# Patient Record
Sex: Male | Born: 1970 | Race: White | Hispanic: No | Marital: Married
Health system: Southern US, Community
[De-identification: ages and names within clinical notes are randomized; demographics above are authoritative.]

---

## 2014-08-18 ENCOUNTER — Encounter (HOSPITAL_COMMUNITY): Payer: Self-pay | Admitting: Emergency Medicine

## 2014-08-18 DIAGNOSIS — R509 Fever, unspecified: Secondary | ICD-10-CM | POA: Diagnosis not present

## 2014-08-18 DIAGNOSIS — H5710 Ocular pain, unspecified eye: Secondary | ICD-10-CM | POA: Diagnosis not present

## 2014-08-18 DIAGNOSIS — H53149 Visual discomfort, unspecified: Secondary | ICD-10-CM | POA: Diagnosis not present

## 2014-08-18 DIAGNOSIS — M436 Torticollis: Secondary | ICD-10-CM | POA: Insufficient documentation

## 2014-08-18 DIAGNOSIS — R5383 Other fatigue: Secondary | ICD-10-CM | POA: Diagnosis not present

## 2014-08-18 DIAGNOSIS — R51 Headache: Secondary | ICD-10-CM | POA: Diagnosis present

## 2014-08-18 LAB — CBC WITH DIFFERENTIAL/PLATELET
BASOS PCT: 0 % (ref 0–1)
Basophils Absolute: 0 10*3/uL (ref 0.0–0.1)
EOS PCT: 0 % (ref 0–5)
Eosinophils Absolute: 0 10*3/uL (ref 0.0–0.7)
HCT: 40.3 % (ref 39.0–52.0)
HEMOGLOBIN: 14.2 g/dL (ref 13.0–17.0)
Lymphocytes Relative: 13 % (ref 12–46)
Lymphs Abs: 1.1 10*3/uL (ref 0.7–4.0)
MCH: 30.9 pg (ref 26.0–34.0)
MCHC: 35.2 g/dL (ref 30.0–36.0)
MCV: 87.8 fL (ref 78.0–100.0)
MONO ABS: 0.6 10*3/uL (ref 0.1–1.0)
Monocytes Relative: 8 % (ref 3–12)
Neutro Abs: 6.1 10*3/uL (ref 1.7–7.7)
Neutrophils Relative %: 79 % — ABNORMAL HIGH (ref 43–77)
Platelets: 172 10*3/uL (ref 150–400)
RBC: 4.59 MIL/uL (ref 4.22–5.81)
RDW: 12.5 % (ref 11.5–15.5)
WBC: 7.8 10*3/uL (ref 4.0–10.5)

## 2014-08-18 LAB — COMPREHENSIVE METABOLIC PANEL
ALBUMIN: 4.5 g/dL (ref 3.5–5.2)
ALT: 27 U/L (ref 0–53)
ANION GAP: 8 (ref 5–15)
AST: 25 U/L (ref 0–37)
Alkaline Phosphatase: 56 U/L (ref 39–117)
BUN: 10 mg/dL (ref 6–23)
CALCIUM: 9.2 mg/dL (ref 8.4–10.5)
CO2: 27 mmol/L (ref 19–32)
CREATININE: 1.02 mg/dL (ref 0.50–1.35)
Chloride: 101 mEq/L (ref 96–112)
GFR calc Af Amer: >90 mL/min (ref >90)
GFR, EST NON AFRICAN AMERICAN: 88 mL/min — AB (ref >90)
Glucose, Bld: 104 mg/dL — ABNORMAL HIGH (ref 70–99)
Potassium: 3.7 mmol/L (ref 3.5–5.1)
Sodium: 136 mmol/L (ref 135–145)
Total Bilirubin: 0.5 mg/dL (ref 0.3–1.2)
Total Protein: 7.3 g/dL (ref 6.0–8.3)

## 2014-08-18 MED ORDER — IBUPROFEN 800 MG PO TABS
800.0000 mg | ORAL_TABLET | Freq: Once | ORAL | Status: AC
  Administered 2014-08-18: 800 mg via ORAL
  Filled 2014-08-18: qty 2

## 2014-08-18 NOTE — ED Notes (Signed)
Per Patient: States today at 0330 he woke up with a headache and stiff neck, reports fever as high as 102.0. Pt does report photophobia, and reports it "hurts to move his eyes." Ax4, NAD.

## 2014-08-18 NOTE — ED Notes (Signed)
Pt reports he has taken OTC Naproxen without pain relief.

## 2014-08-19 ENCOUNTER — Emergency Department (HOSPITAL_COMMUNITY)
Admission: EM | Admit: 2014-08-19 | Discharge: 2014-08-19 | Disposition: A | Discharge status: Home / Self Care | Payer: BC Managed Care – PPO | Attending: Emergency Medicine | Admitting: Emergency Medicine

## 2014-08-19 DIAGNOSIS — R519 Headache, unspecified: Secondary | ICD-10-CM

## 2014-08-19 DIAGNOSIS — R509 Fever, unspecified: Secondary | ICD-10-CM

## 2014-08-19 DIAGNOSIS — R51 Headache: Secondary | ICD-10-CM

## 2014-08-19 NOTE — ED Provider Notes (Signed)
CSN: [id number]     Arrival date & time [id number]/26/15  2143 History  This chart was scribed for Dustin Mercado Acie Custis, MD by Abel PrestoKara Demonbreun, ED Scribe. This patient was seen in room D31C/D31C and the patient's care was started at 2:20 AM.    Chief Complaint  Patient presents with  . Headache  . Torticollis    Patient is a 43 y.o. male presenting with headaches. The history is provided by the patient. No language interpreter was used.  Headache Associated symptoms: eye pain, fatigue, fever, neck stiffness and photophobia   Associated symptoms: no abdominal pain, no back pain, no congestion, no cough, no diarrhea, no sore throat and no vomiting     HPI Comments: Dustin Mercado is a 43 y.o. male who presents to the Emergency Department complaining of waxing and waning headache with onset at 3 am yesterday morning.  Pt notes and associated fever of 102, stiff neck, fatigue, photophobia, and pain with ocular movement.  Pt notes headache feels better when he is not febrile.  Pt has taken Aleve for relief. Pt notes his daughters both have a cough. Pt denies cough, congestion, rhinorrhea, sore throat, diarrhea, vomiting, back pain, abdominal pain, confusion.     History reviewed. No pertinent past medical history. History reviewed. No pertinent past surgical history. No family history on file. History  Substance Use Topics  . Smoking status: Not on file  . Smokeless tobacco: Not on file  . Alcohol Use: Not on file    Review of Systems  Constitutional: Positive for fever and fatigue.  HENT: Negative for congestion, rhinorrhea and sore throat.   Eyes: Positive for photophobia and pain.  Respiratory: Negative for cough.   Gastrointestinal: Negative for vomiting, abdominal pain and diarrhea.  Musculoskeletal: Positive for neck stiffness. Negative for back pain.  Neurological: Positive for headaches.  Psychiatric/Behavioral: Negative for confusion.  All other systems reviewed and are  negative.     Allergies  Review of patient's allergies indicates no known allergies.  Home Medications   Prior to Admission medications   Not on File   BP 106/66 mmHg  Pulse 80  Temp(Src) 98 F (36.7 C) (Oral)  Resp 18  Ht 6' (1.829 m)  Wt 230 lb (104.327 kg)  BMI 31.19 kg/m2  SpO2 97% Physical Exam  Constitutional: He is oriented to person, place, and time. He appears well-developed and well-nourished.  HENT:  Head: Normocephalic.  Right Ear: External ear normal.  Left Ear: External ear normal.  Nose: Nose normal.  Mouth/Throat: Oropharynx is clear and moist. No oropharyngeal exudate.  Mild pain with ROM but no stiffness appreciated   Eyes: EOM are normal. Pupils are equal, round, and reactive to light.  Neck: Normal range of motion. Neck supple.  Cardiovascular: Normal rate, regular rhythm, normal heart sounds and intact distal pulses.  Exam reveals no friction rub.   No murmur heard. Pulmonary/Chest: Effort normal and breath sounds normal. No respiratory distress. He has no wheezes. He has no rales.  Abdominal: Soft. Bowel sounds are normal. He exhibits no distension. There is no tenderness. There is no rebound and no guarding.  Musculoskeletal: Normal range of motion.  Neurological: He is alert and oriented to person, place, and time.  Cranial nerves II-XII grossly intact 5/5 strength in all extremities  Skin: Skin is warm and dry.  Psychiatric: He has a normal mood and affect. His behavior is normal.  Nursing note and vitals reviewed.   ED Course  Procedures (including critical  care time) DIAGNOSTIC STUDIES: Oxygen Saturation is 100% on room air, normal by my interpretation.    COORDINATION OF CARE: 2:24 AM Discussed treatment plan with patient at beside, the patient agrees with the plan and has no further questions at this time.   Labs Review Labs Reviewed  CBC WITH DIFFERENTIAL - Abnormal; Notable for the following:    Neutrophils Relative % 79 (*)     All other components within normal limits  COMPREHENSIVE METABOLIC PANEL - Abnormal; Notable for the following:    Glucose, Bld 104 (*)    GFR calc non Af Amer 88 (*)    All other components within normal limits    Imaging Review No results found.   EKG Interpretation None      MDM   Final diagnoses:  Acute nonintractable headache, unspecified headache type  Fever in adult   Patient with 1 day of headache, fever and neck stiffness. Patient able to fully range neck in ED. No stiffness noted. Normal neuro exam. Given patient's well appearance, normal neck ROM, normal neuro exam, this is likely viral. However, due to his symptoms I had a long discussion with patient and wife about risks/benefits of LP. After hearing this, they have jointly decided to forgo LP at this time and treat with NSAIDs/tylenol at home. I feel this is reasonable give his nontoxic appearance and he clearly has medical decision making capacity. Discussed strict return precautions, which he and wife verbalized understanding of.  I personally performed the services described in this documentation, which was scribed in my presence. The recorded information has been reviewed and is accurate.      Dustin Mercado Dustin Coach, MD 08/19/14 [phone]

## 2014-09-07 ENCOUNTER — Ambulatory Visit
Admission: RE | Admit: 2014-09-07 | Discharge: 2014-09-07 | Disposition: A | Discharge status: Home / Self Care | Payer: BLUE CROSS/BLUE SHIELD | Source: Ambulatory Visit | Attending: Family Medicine | Admitting: Family Medicine

## 2014-09-07 ENCOUNTER — Ambulatory Visit
Admission: RE | Admit: 2014-09-07 | Discharge: 2014-09-07 | Disposition: A | Discharge status: Home / Self Care | Payer: BLUE CROSS/BLUE SHIELD | Source: Ambulatory Visit | Attending: Family | Admitting: Family

## 2014-09-07 ENCOUNTER — Other Ambulatory Visit: Payer: Self-pay | Admitting: Family Medicine

## 2014-09-07 ENCOUNTER — Other Ambulatory Visit: Payer: Self-pay | Admitting: Family

## 2014-09-07 DIAGNOSIS — R911 Solitary pulmonary nodule: Secondary | ICD-10-CM

## 2014-09-07 DIAGNOSIS — R05 Cough: Secondary | ICD-10-CM

## 2014-09-07 DIAGNOSIS — R059 Cough, unspecified: Secondary | ICD-10-CM

## 2015-08-10 IMAGING — CR DG CHEST 2V
2 series · 2 of 2 positions shown · non-contrast
Comparison: None.

CLINICAL DATA: Initial encounter for cough and shortness of breath
for 10 day duration

EXAM:
CHEST  2 VIEW

[w chest pa]
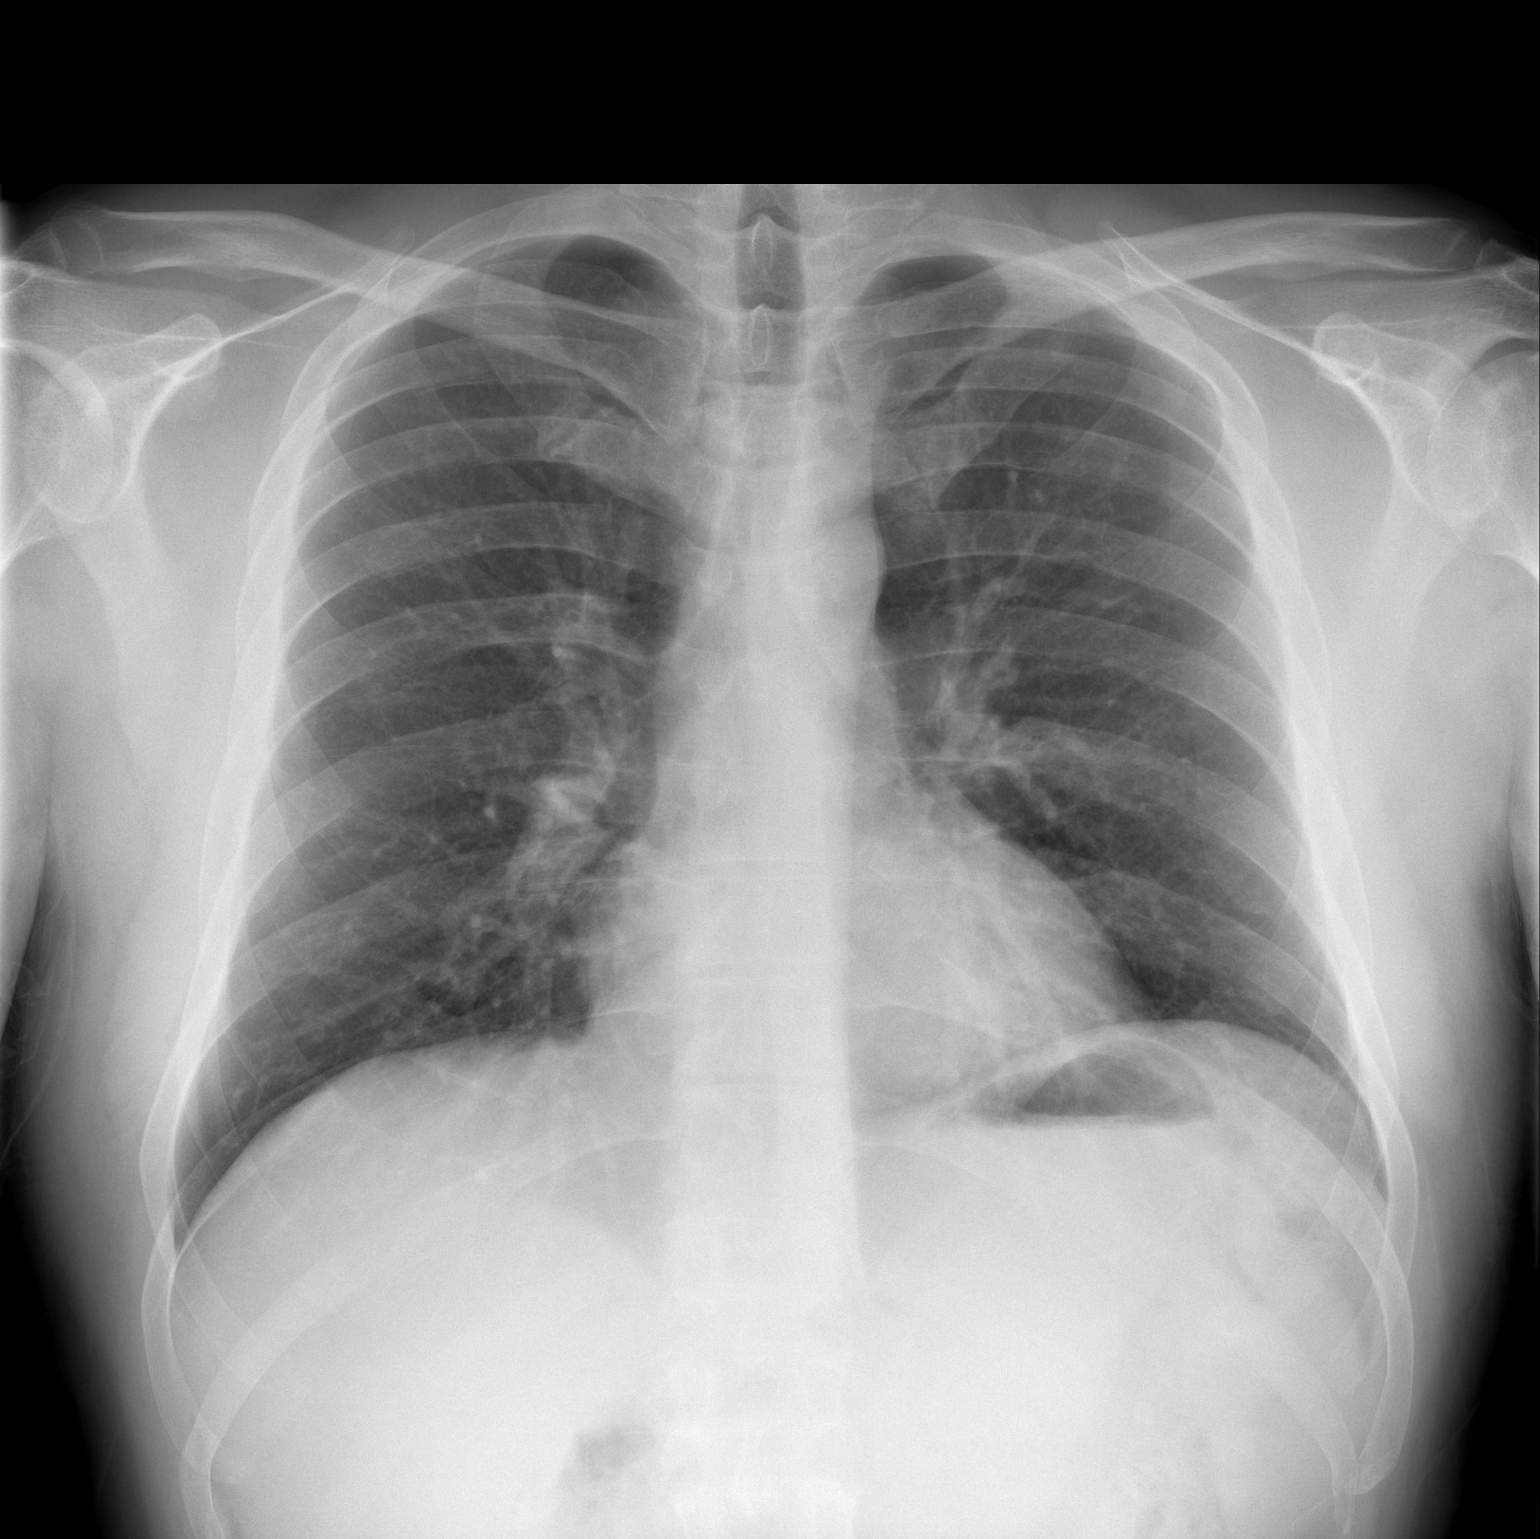

[w chest lat]
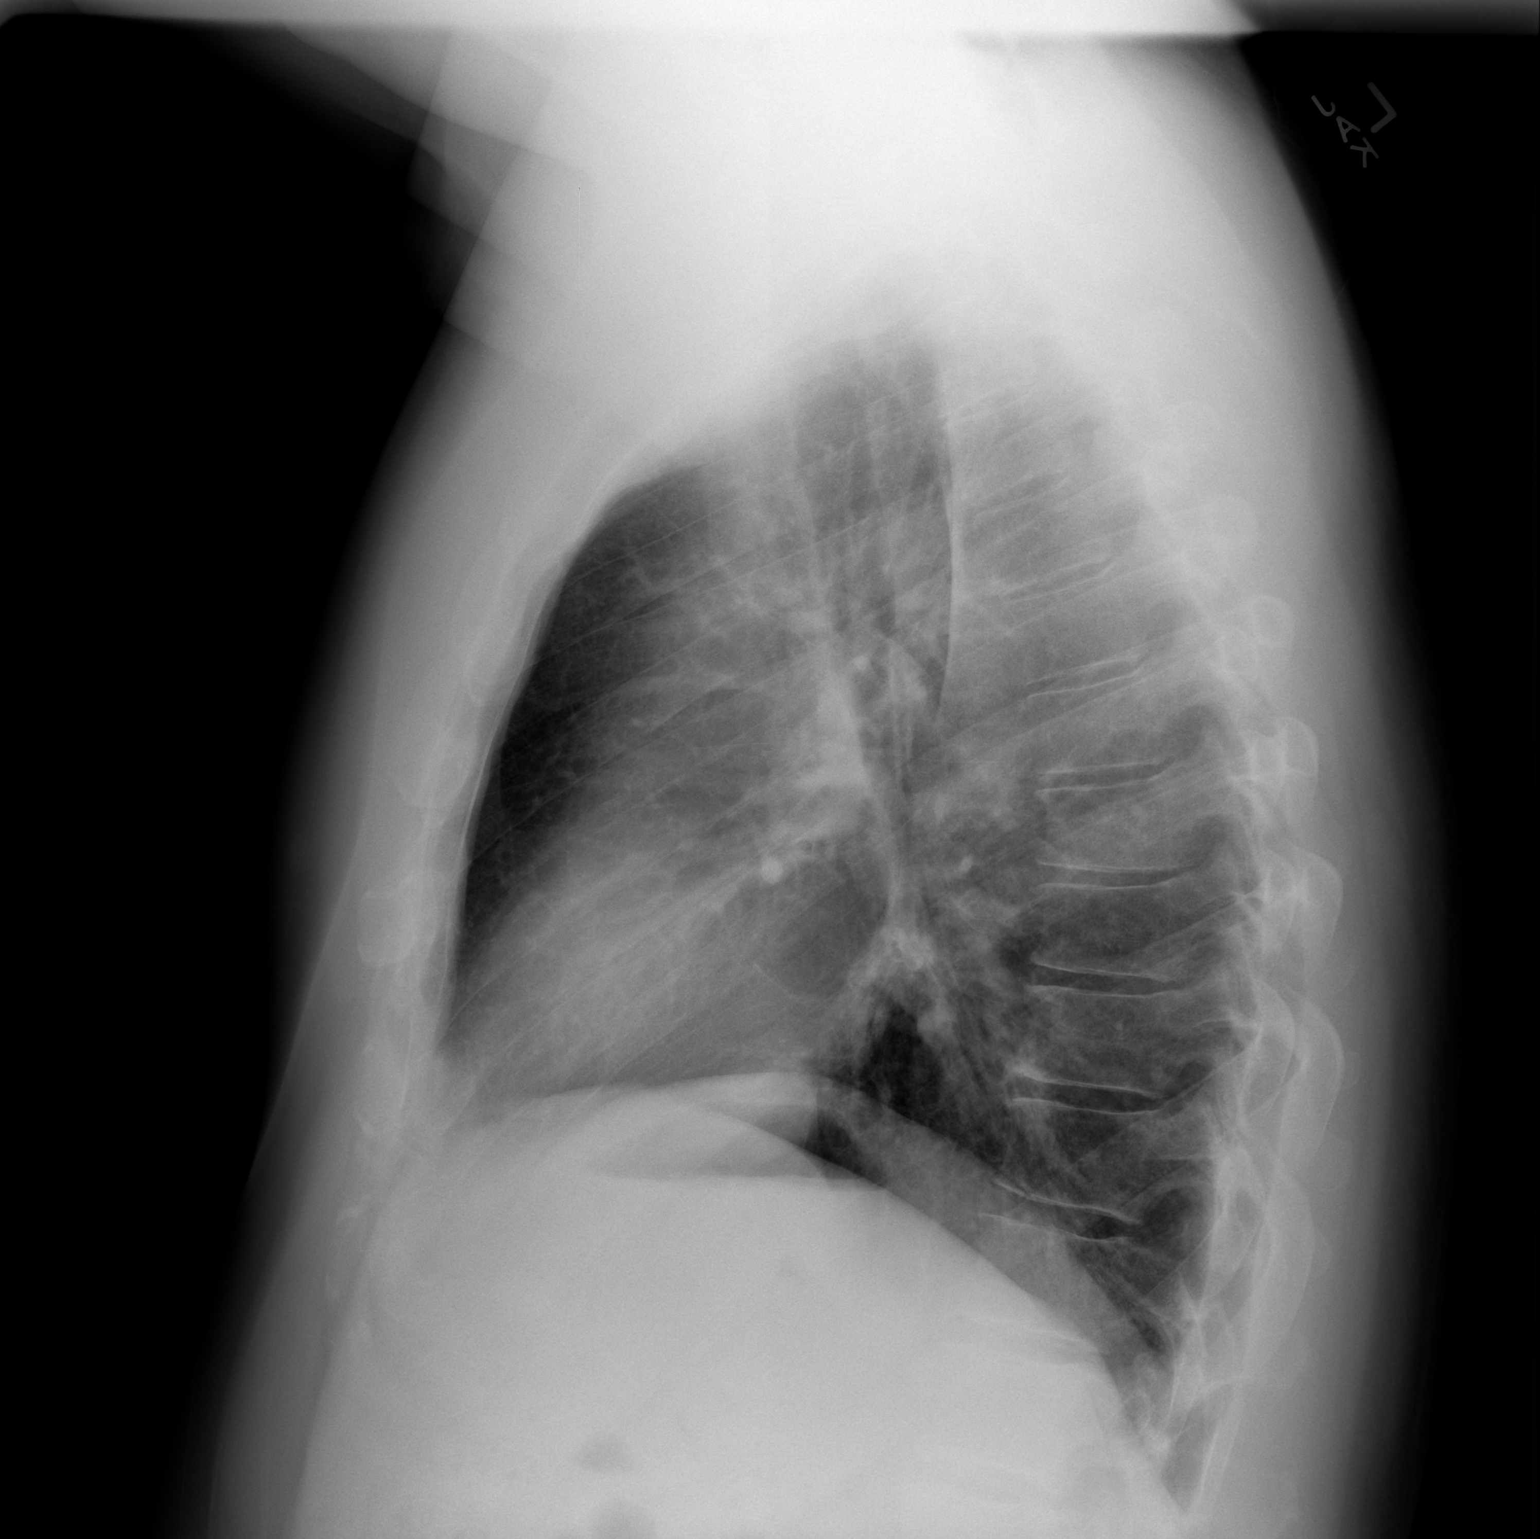

[2 of 2 positions shown; findings below may reference images not displayed]

FINDINGS: No evidence for pulmonary edema or focal airspace consolidation. No
pleural effusion. Several small, subtle nodular densities are seen
in the left upper lobe. The cardiopericardial silhouette is within
normal limits for size. Imaged bony structures of the thorax are
intact.
IMPRESSION: Three subtle nodular densities overlying the left upper lobe. CT
chest without contrast recommended to further evaluate.

These results will be called to the ordering clinician or
representative by the Radiologist Assistant, and communication
documented in the PACS or zVision Dashboard.

## 2015-08-10 IMAGING — CT CT CHEST W/O CM
4 series · 18 of 29 positions shown, 19 images · non-contrast
Comparison: 09/07/2014 chest radiograph

CLINICAL DATA: Right upper lobe pulmonary nodules. Recent
bronchitis. Shortness of breath.

EXAM:
CT CHEST WITHOUT CONTRAST
TECHNIQUE: Multidetector CT imaging of the chest was performed following the
standard protocol without IV contrast..

[Series 2: chest w/o · axial · non-contrast · 0.70mm/px · z∈[-212,-52]mm · 4 of 66 slices shown, 5 images]
[im 17/66  mediastinal]
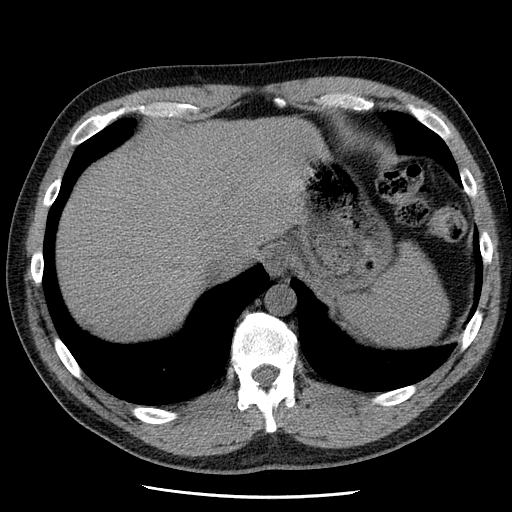
[im 17/66  lung]
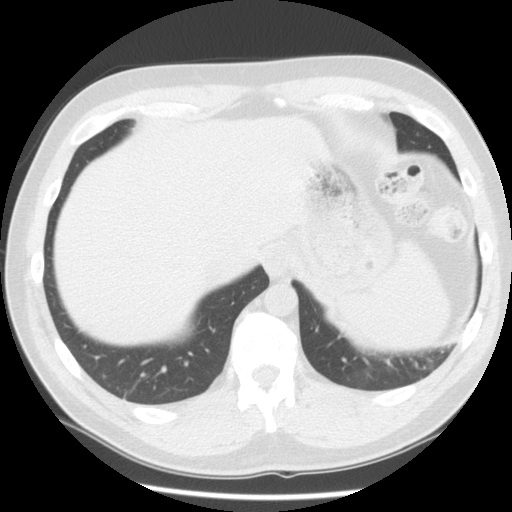
[im 33/66  lung]
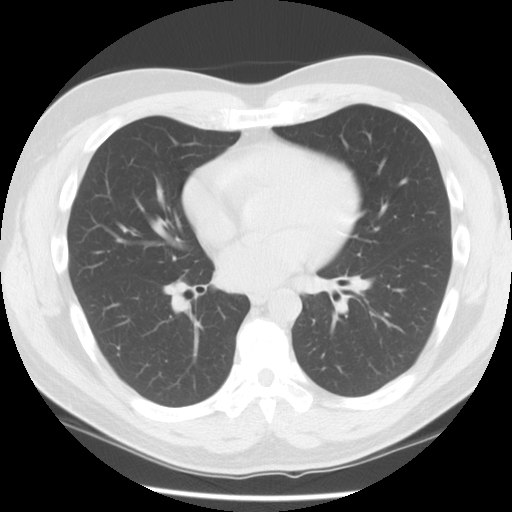
[im 34/66  lung]
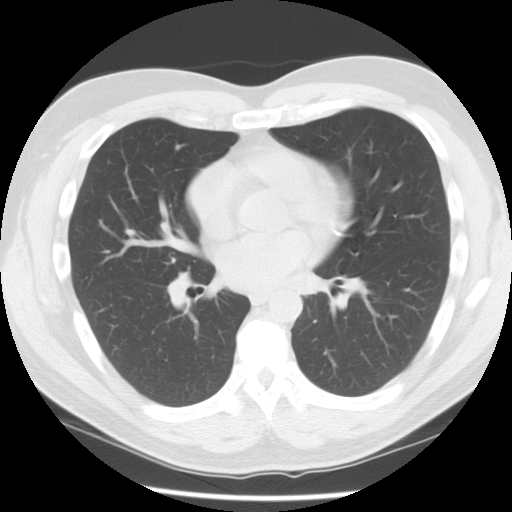
[im 49/66  lung]
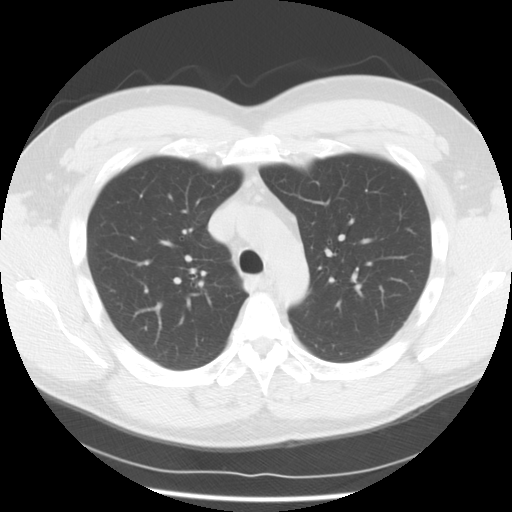

[Series 3: lung windows · axial · 0.70mm/px · z∈[-212,-47]mm · 3 of 67 slices shown]
[im 17/67  lung]
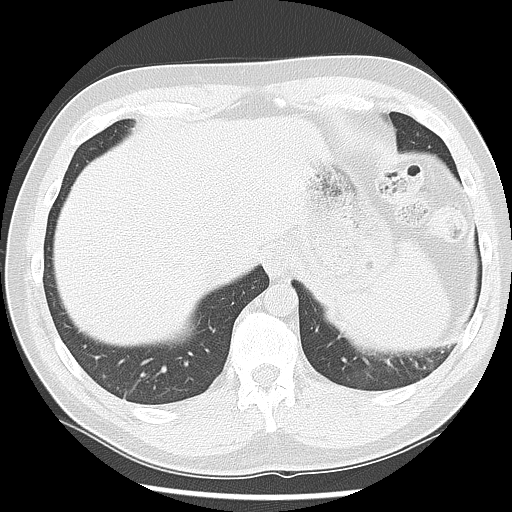
[im 34/67  lung]
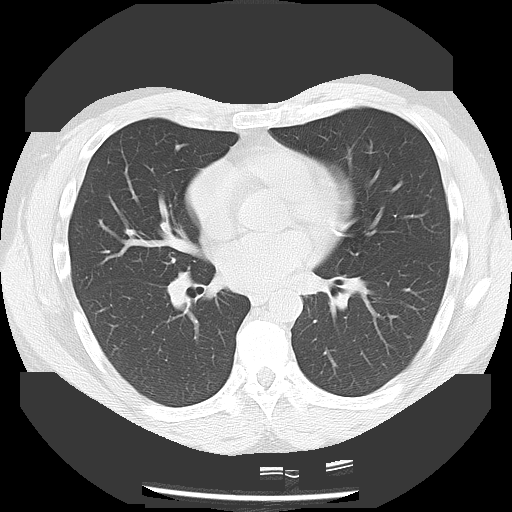
[im 50/67  lung]
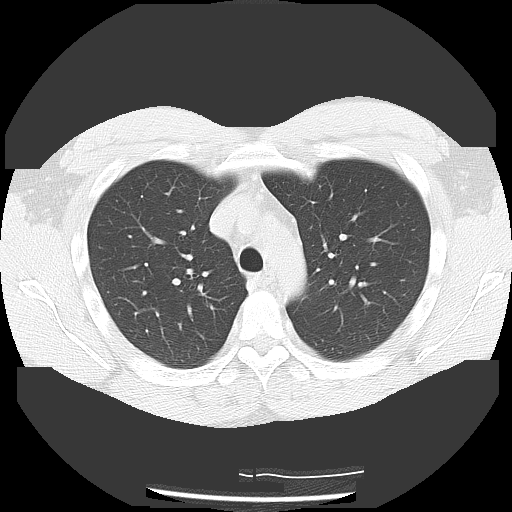

[Series 400: sag · sagittal · 0.70mm/px · 8 of 143 slices shown]
[im 16/143  lung]
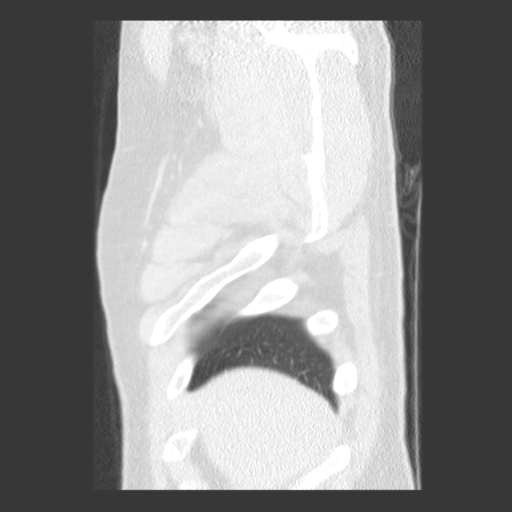
[im 32/143  lung]
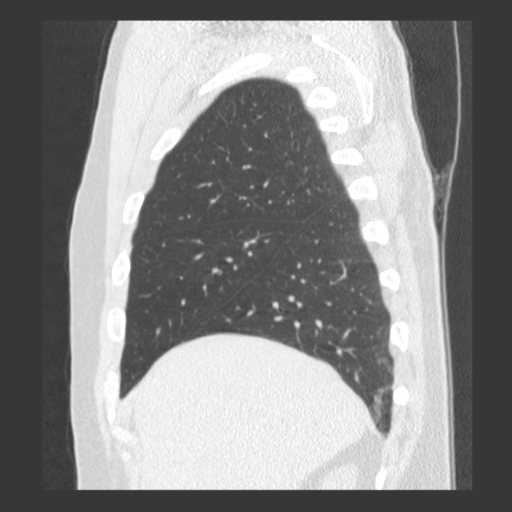
[im 48/143  lung]
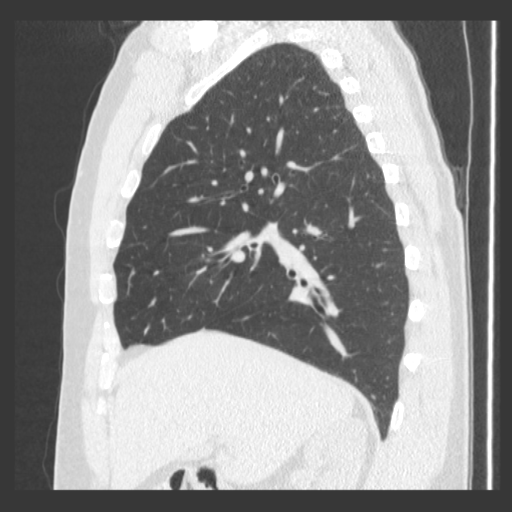
[im 64/143  lung]
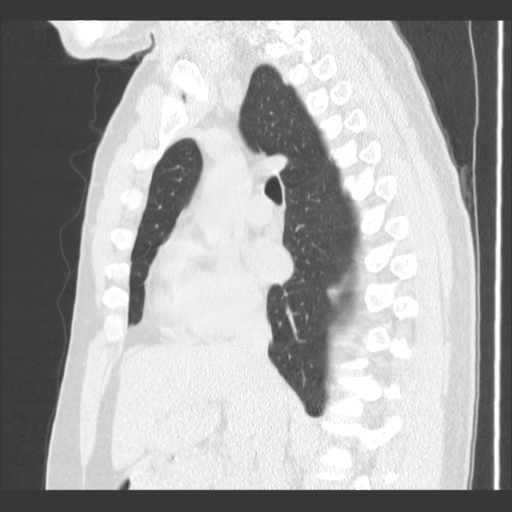
[im 79/143  lung]
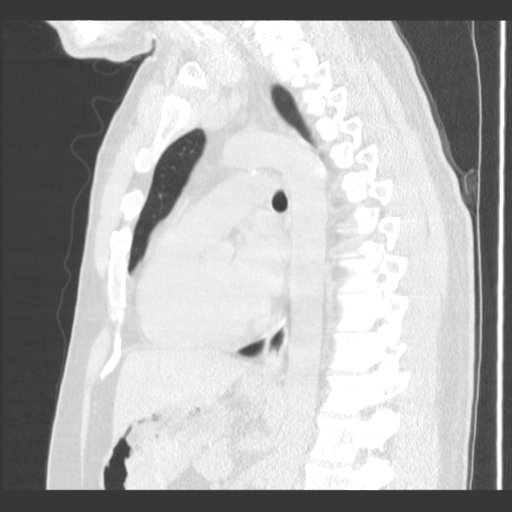
[im 95/143  lung]
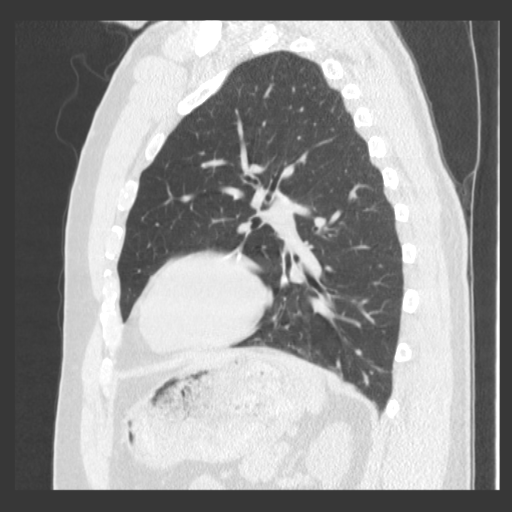
[im 111/143  lung]
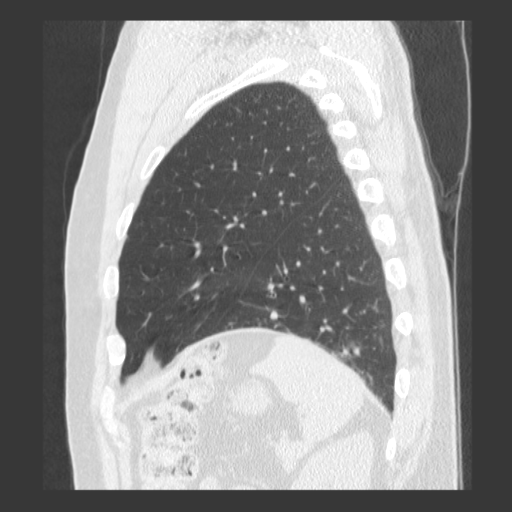
[im 127/143  lung]
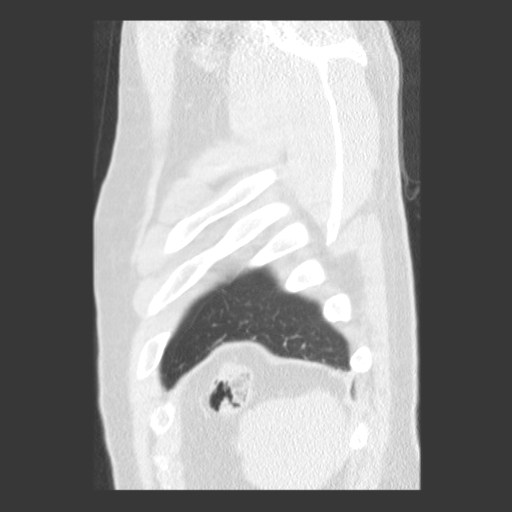

[Series 401: cor · coronal · 0.70mm/px · 3 of 118 slices shown]
[im 17/118  lung]
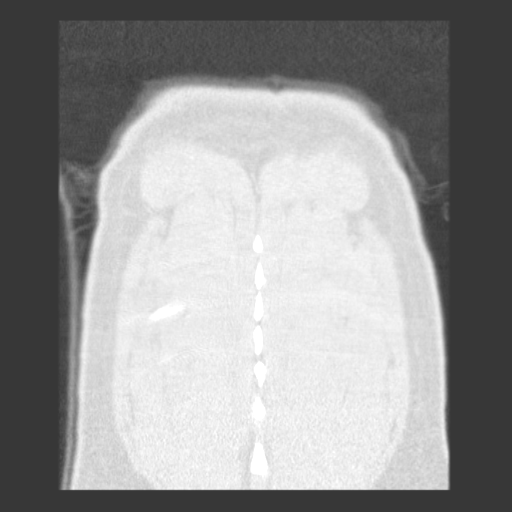
[im 34/118  lung]
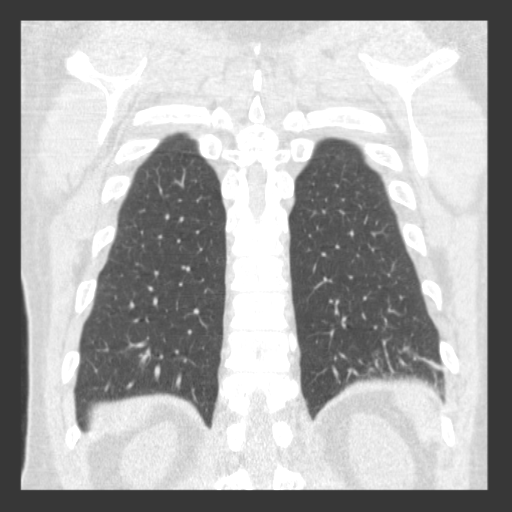
[im 51/118  lung]
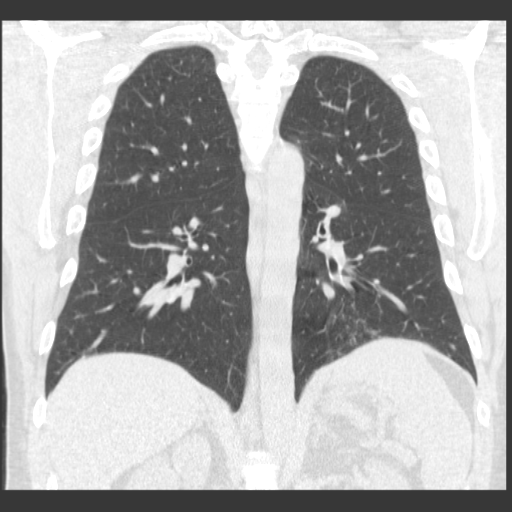

[18 of 29 positions shown; findings below may reference images not displayed]

FINDINGS: Mediastinum/Nodes: No pathologic thoracic adenopathy.

Lungs/Pleura: Small subpleural lymph node on image 39 series 400,
benign imaging characteristics. Subsegmental atelectasis in the
posterior basal segment right lower lobe and also in the left lower
lobe. 4 mm nodule in the left lower lobe, image 43 series 3. Mild
airway thickening is present in the left lower lobe. 3 mm nodule,
right lower lobe, image 44 series 3. No nodules are observed in the
left upper lobe to correspond with the faint density seen on
radiography.

Upper abdomen: Unremarkable

Musculoskeletal: Unremarkable
IMPRESSION: 1. Single pulmonary nodules in both lower lobes, 4 mm on the left
and 3 mm on the right. If the patient is at high risk for
bronchogenic carcinoma, follow-up chest CT at 1 year is recommended.
If the patient is at low risk, no follow-up is needed. This
recommendation follows the consensus statement: Guidelines for
Management of Small Pulmonary Nodules Detected on CT Scans: A
Statement from the [HOSPITAL] as published in Radiology
0336; [DATE].
2. Airway thickening in the left lower lobe suggesting bronchitis or
resolving bronchopneumonia.

## 2015-08-29 ENCOUNTER — Other Ambulatory Visit: Payer: Self-pay | Admitting: Family

## 2015-08-29 DIAGNOSIS — R911 Solitary pulmonary nodule: Secondary | ICD-10-CM

## 2015-09-04 ENCOUNTER — Ambulatory Visit
Admission: RE | Admit: 2015-09-04 | Discharge: 2015-09-04 | Disposition: A | Discharge status: Home / Self Care | Payer: BLUE CROSS/BLUE SHIELD | Source: Ambulatory Visit | Attending: Family | Admitting: Family

## 2015-09-04 DIAGNOSIS — R911 Solitary pulmonary nodule: Secondary | ICD-10-CM

## 2020-01-09 DIAGNOSIS — D225 Melanocytic nevi of trunk: Secondary | ICD-10-CM | POA: Diagnosis not present

## 2020-01-09 DIAGNOSIS — L821 Other seborrheic keratosis: Secondary | ICD-10-CM | POA: Diagnosis not present

## 2020-01-09 DIAGNOSIS — L300 Nummular dermatitis: Secondary | ICD-10-CM | POA: Diagnosis not present

## 2020-01-09 DIAGNOSIS — L237 Allergic contact dermatitis due to plants, except food: Secondary | ICD-10-CM | POA: Diagnosis not present

## 2020-03-25 DIAGNOSIS — Z0000 Encounter for general adult medical examination without abnormal findings: Secondary | ICD-10-CM | POA: Diagnosis not present

## 2020-03-25 DIAGNOSIS — Z1389 Encounter for screening for other disorder: Secondary | ICD-10-CM | POA: Diagnosis not present

## 2020-03-25 DIAGNOSIS — Z125 Encounter for screening for malignant neoplasm of prostate: Secondary | ICD-10-CM | POA: Diagnosis not present

## 2020-03-25 DIAGNOSIS — Z23 Encounter for immunization: Secondary | ICD-10-CM | POA: Diagnosis not present

## 2020-03-25 DIAGNOSIS — Z13220 Encounter for screening for lipoid disorders: Secondary | ICD-10-CM | POA: Diagnosis not present

## 2023-01-05 DIAGNOSIS — G4733 Obstructive sleep apnea (adult) (pediatric): Secondary | ICD-10-CM | POA: Diagnosis not present

## 2023-01-13 DIAGNOSIS — L821 Other seborrheic keratosis: Secondary | ICD-10-CM | POA: Diagnosis not present

## 2023-01-13 DIAGNOSIS — L570 Actinic keratosis: Secondary | ICD-10-CM | POA: Diagnosis not present

## 2023-01-13 DIAGNOSIS — L578 Other skin changes due to chronic exposure to nonionizing radiation: Secondary | ICD-10-CM | POA: Diagnosis not present

## 2023-01-13 DIAGNOSIS — D225 Melanocytic nevi of trunk: Secondary | ICD-10-CM | POA: Diagnosis not present

## 2023-04-21 DIAGNOSIS — Z0000 Encounter for general adult medical examination without abnormal findings: Secondary | ICD-10-CM | POA: Diagnosis not present

## 2023-04-21 DIAGNOSIS — E669 Obesity, unspecified: Secondary | ICD-10-CM | POA: Diagnosis not present

## 2023-04-21 DIAGNOSIS — Z125 Encounter for screening for malignant neoplasm of prostate: Secondary | ICD-10-CM | POA: Diagnosis not present

## 2023-04-21 DIAGNOSIS — Z13220 Encounter for screening for lipoid disorders: Secondary | ICD-10-CM | POA: Diagnosis not present

## 2024-01-19 DIAGNOSIS — G4733 Obstructive sleep apnea (adult) (pediatric): Secondary | ICD-10-CM | POA: Diagnosis not present

## 2024-01-27 DIAGNOSIS — L739 Follicular disorder, unspecified: Secondary | ICD-10-CM | POA: Diagnosis not present

## 2024-01-27 DIAGNOSIS — L719 Rosacea, unspecified: Secondary | ICD-10-CM | POA: Diagnosis not present

## 2024-01-27 DIAGNOSIS — L578 Other skin changes due to chronic exposure to nonionizing radiation: Secondary | ICD-10-CM | POA: Diagnosis not present

## 2024-01-27 DIAGNOSIS — L84 Corns and callosities: Secondary | ICD-10-CM | POA: Diagnosis not present

## 2024-05-05 DIAGNOSIS — Z23 Encounter for immunization: Secondary | ICD-10-CM | POA: Diagnosis not present

## 2024-05-17 DIAGNOSIS — R5383 Other fatigue: Secondary | ICD-10-CM | POA: Diagnosis not present

## 2024-05-17 DIAGNOSIS — K625 Hemorrhage of anus and rectum: Secondary | ICD-10-CM | POA: Diagnosis not present

## 2024-05-17 DIAGNOSIS — G4733 Obstructive sleep apnea (adult) (pediatric): Secondary | ICD-10-CM | POA: Diagnosis not present

## 2024-05-17 DIAGNOSIS — Z0000 Encounter for general adult medical examination without abnormal findings: Secondary | ICD-10-CM | POA: Diagnosis not present

## 2024-05-19 DIAGNOSIS — Z125 Encounter for screening for malignant neoplasm of prostate: Secondary | ICD-10-CM | POA: Diagnosis not present

## 2024-05-19 DIAGNOSIS — Z0000 Encounter for general adult medical examination without abnormal findings: Secondary | ICD-10-CM | POA: Diagnosis not present

## 2024-05-19 DIAGNOSIS — Z789 Other specified health status: Secondary | ICD-10-CM | POA: Diagnosis not present

## 2024-05-19 DIAGNOSIS — K625 Hemorrhage of anus and rectum: Secondary | ICD-10-CM | POA: Diagnosis not present

## 2024-05-19 DIAGNOSIS — R5383 Other fatigue: Secondary | ICD-10-CM | POA: Diagnosis not present

## 2024-05-19 DIAGNOSIS — K649 Unspecified hemorrhoids: Secondary | ICD-10-CM | POA: Diagnosis not present
